# Patient Record
Sex: Male | Born: 1960 | Race: White | Hispanic: No | Marital: Married
Health system: Southern US, Community
[De-identification: ages and names within clinical notes are randomized; demographics above are authoritative.]

## PROBLEM LIST (undated history)

## (undated) DIAGNOSIS — E785 Hyperlipidemia, unspecified: Secondary | ICD-10-CM

## (undated) DIAGNOSIS — E119 Type 2 diabetes mellitus without complications: Secondary | ICD-10-CM

## (undated) DIAGNOSIS — I10 Essential (primary) hypertension: Secondary | ICD-10-CM

## (undated) HISTORY — DX: Essential (primary) hypertension: I10

## (undated) HISTORY — DX: Type 2 diabetes mellitus without complications: E11.9

## (undated) HISTORY — DX: Hyperlipidemia, unspecified: E78.5

---

## 2003-12-14 ENCOUNTER — Encounter: Admission: RE | Admit: 2003-12-14 | Discharge: 2003-12-14 | Payer: Self-pay | Admitting: Family Medicine

## 2005-02-10 IMAGING — US US ABDOMEN COMPLETE
1 series · 14 of 25 positions shown · non-contrast
Comparison: none

CLINICAL DATA: Elevated LFTs.  
 ULTRAOSUND ABDOMEN, COMPLETE ? 12/14/03 
 Comparison ? none.

[Series 1: unknown · 0.30mm/px · 14 of 58 slices shown]
[im 1/58]
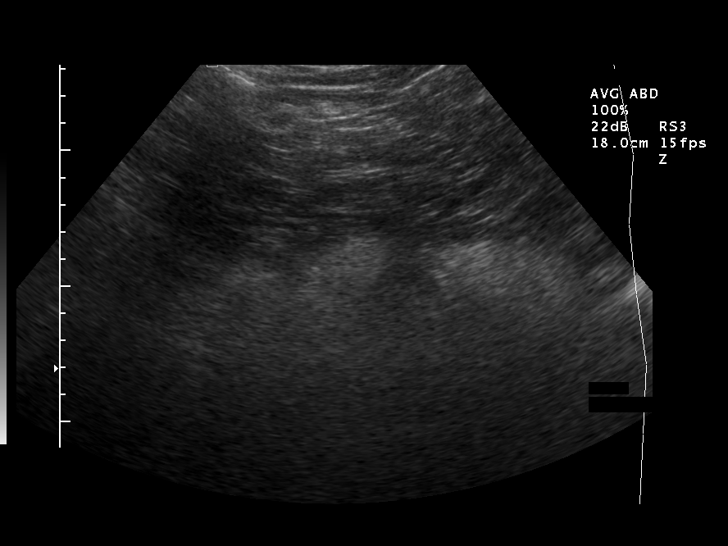
[im 5/58]
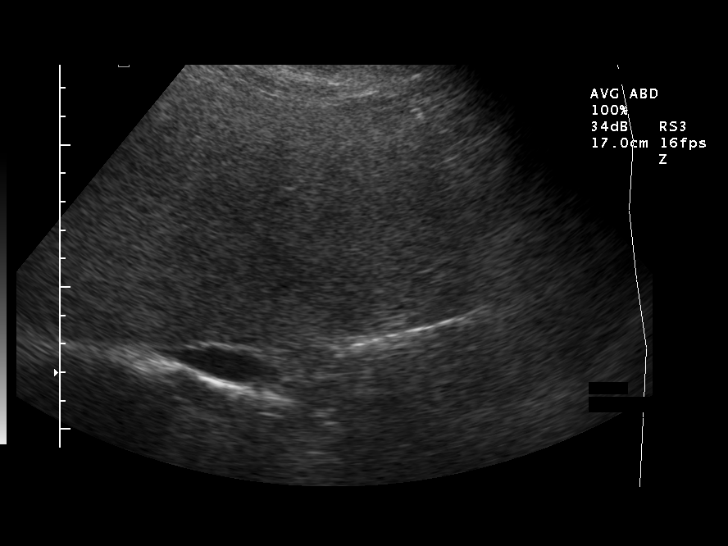
[im 10/58]
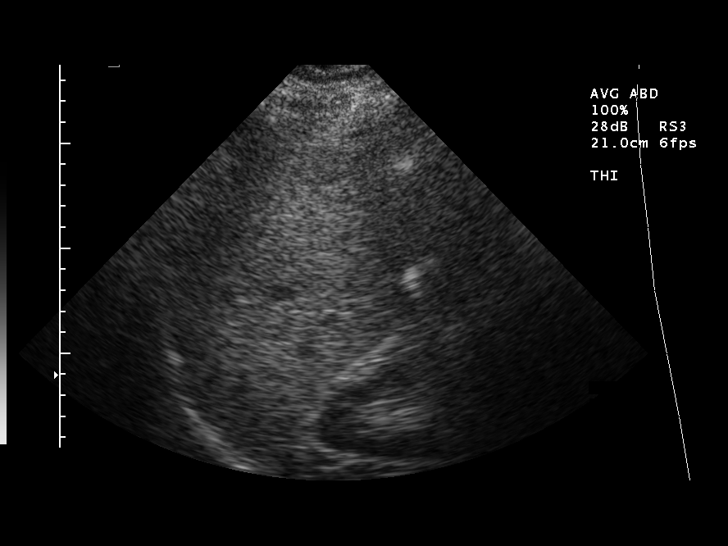
[im 15/58]
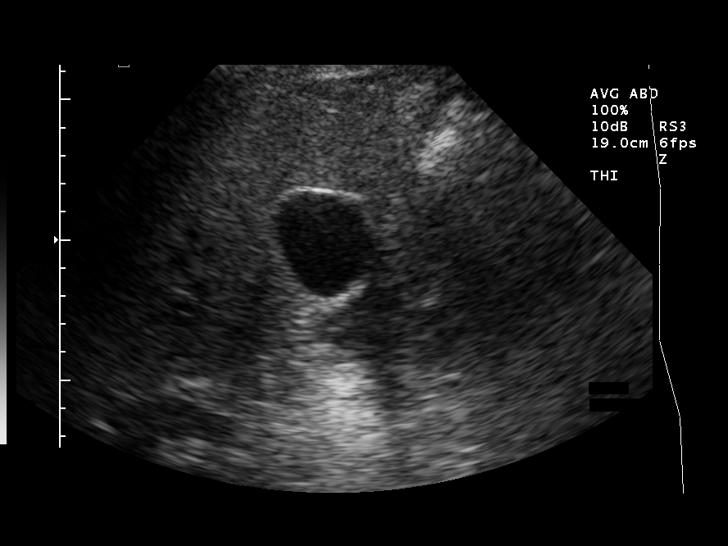
[im 20/58]
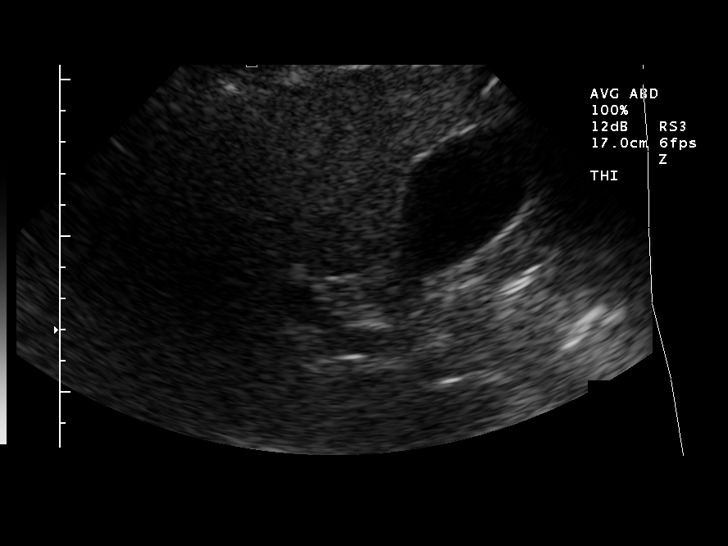
[im 22/58]
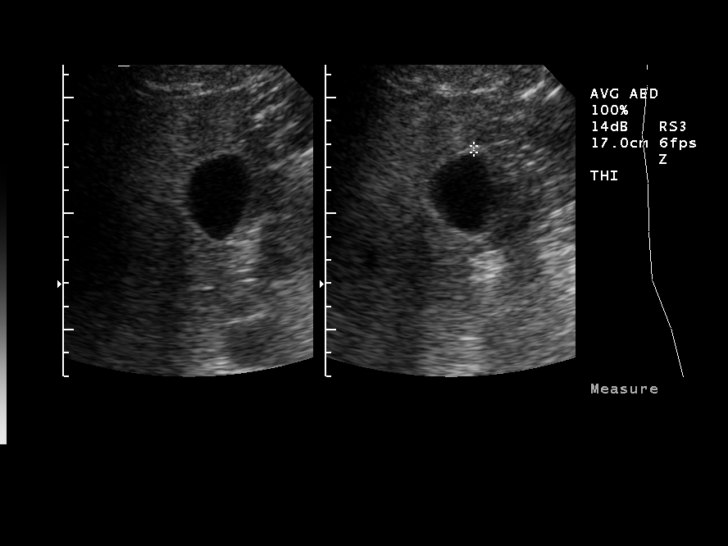
[im 27/58]
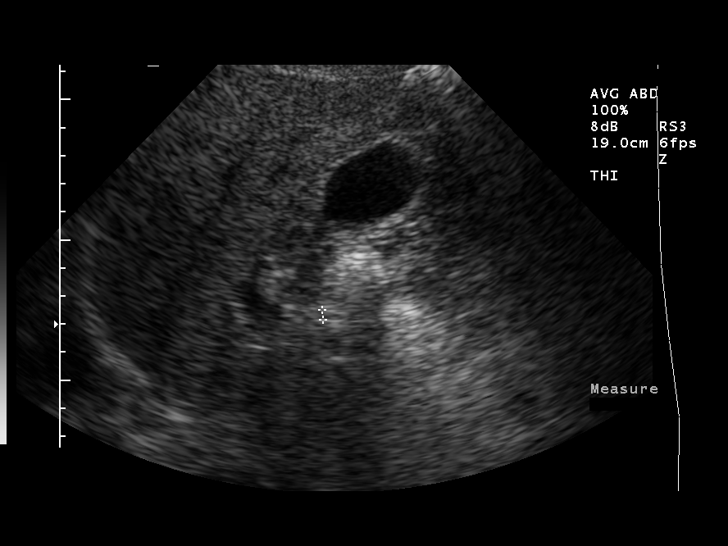
[im 31/58]
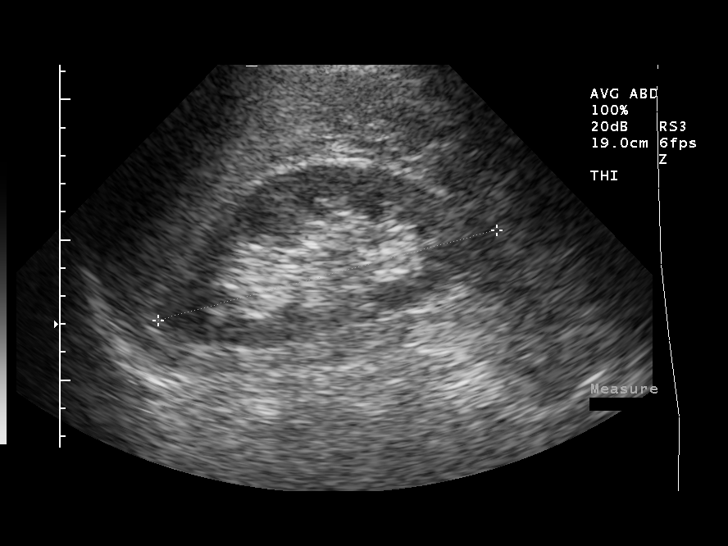
[im 36/58]
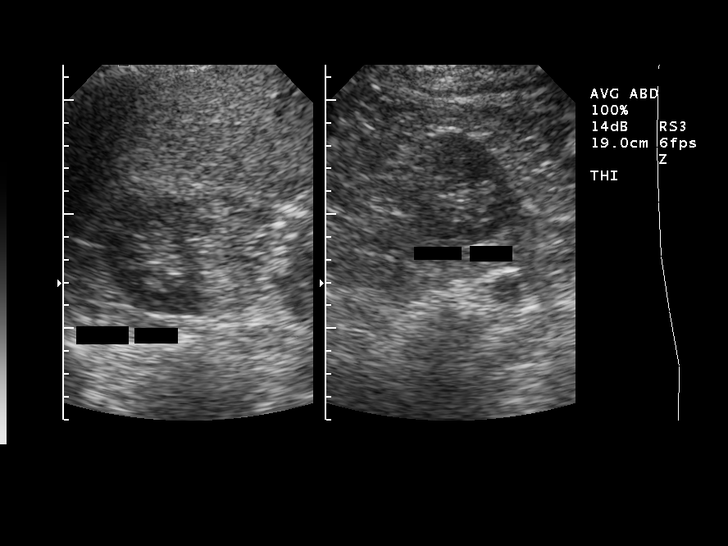
[im 39/58]
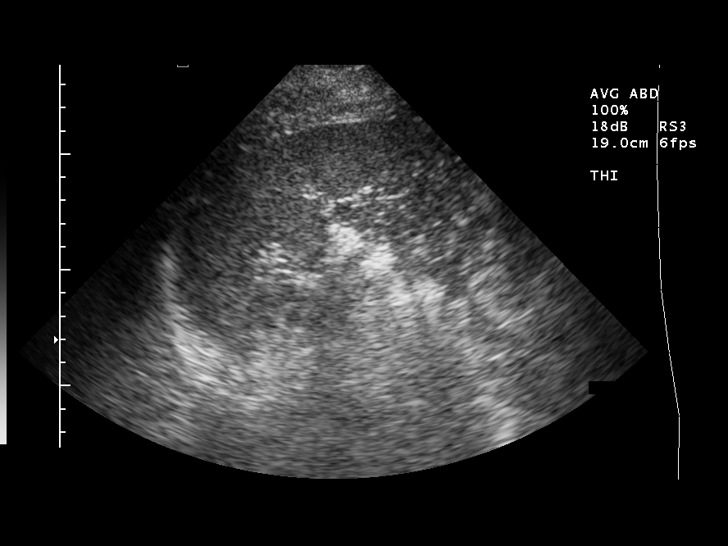
[im 43/58]
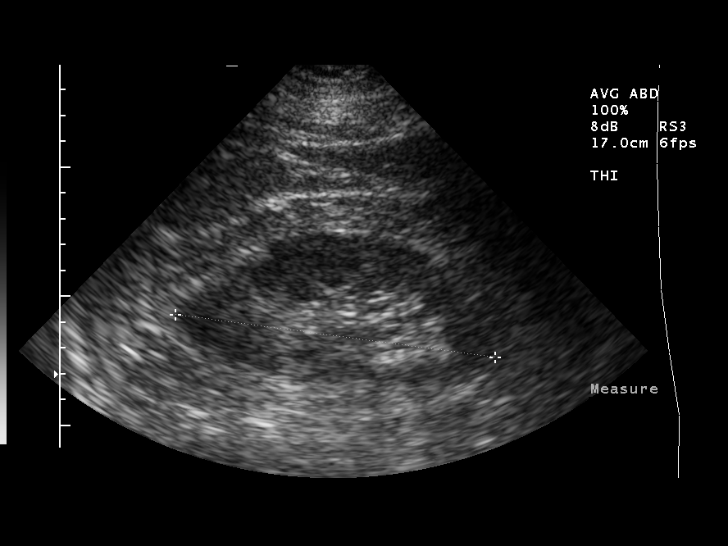
[im 48/58]
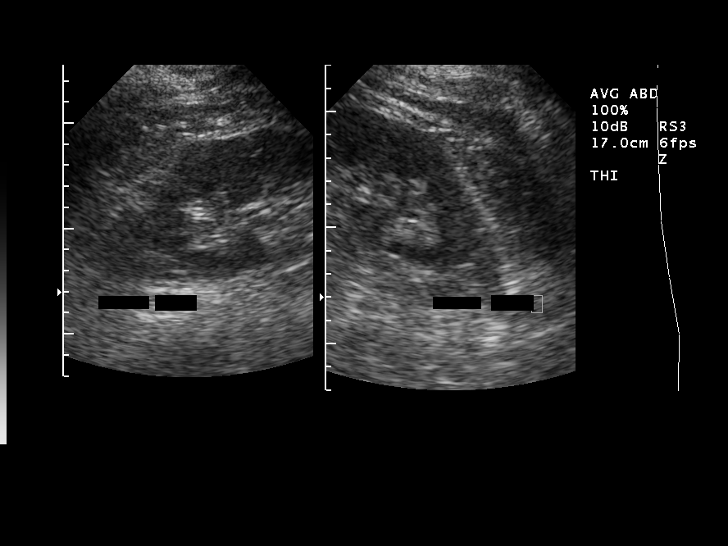
[im 53/58]
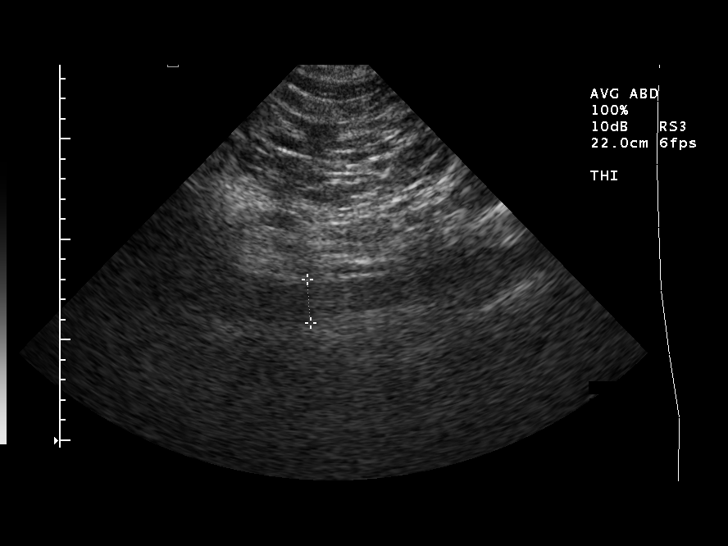
[im 58/58]
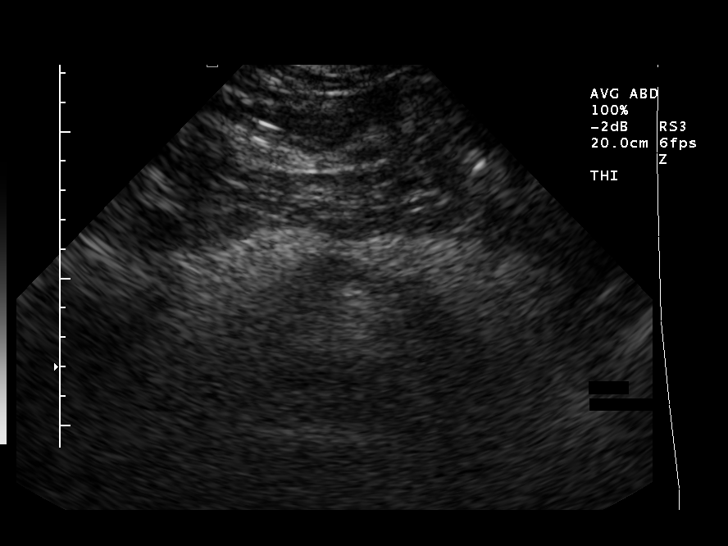

[14 of 25 positions shown; findings below may reference images not displayed]

FINDINGS: Real time multiplanar grayscale ultrasonography of the abdomen was performed.  There is no focal abnormality in the liver or spleen.  No evidence for intra or extrahepatic biliary duct dilatation.  Gallbladder has normal features without evidence for stones, wall thickening, or pericholecystic fluid.  Pancreas is unremarkable.  Right kidney measures 12.5 cm in long axis as does the left kidney.  There is no evidence for renal stones or hydronephrosis.  Abdominal aorta is nondilated and imaged portions of the infrahepatic IVC are unremarkable.  
 IMPRESSION
 Normal abdominal ultrasound.

## 2015-03-19 ENCOUNTER — Ambulatory Visit: Payer: Self-pay

## 2015-03-26 ENCOUNTER — Ambulatory Visit: Payer: Self-pay

## 2015-03-28 ENCOUNTER — Encounter: Payer: BLUE CROSS/BLUE SHIELD | Attending: Family Medicine

## 2015-03-28 VITALS — Ht 74.0 in | Wt 251.7 lb

## 2015-03-28 DIAGNOSIS — Z713 Dietary counseling and surveillance: Secondary | ICD-10-CM | POA: Diagnosis not present

## 2015-03-28 DIAGNOSIS — E119 Type 2 diabetes mellitus without complications: Secondary | ICD-10-CM | POA: Diagnosis present

## 2015-03-28 NOTE — Progress Notes (Signed)
Patient was seen on 03/28/15 for the first of a series of three diabetes self-management courses at the [facility].  Patient Education Plan per assessed needs and concerns is to attend four course education program for Diabetes Self Management Education.  The following learning objectives were met by the patient during this class:  Describe diabetes  State some common risk factors for diabetes  Defines the role of glucose and insulin  Identifies type of diabetes and pathophysiology  Describe the relationship between diabetes and cardiovascular risk  State the members of the Healthcare Team  States the rationale for glucose monitoring  State when to test glucose  State their individual Target Range  State the importance of logging glucose readings  Describe how to interpret glucose readings  Identifies A1C target  Explain the correlation between A1c and eAG values  State symptoms and treatment of high blood glucose  State symptoms and treatment of low blood glucose  Explain proper technique for glucose testing  Identifies proper sharps disposal  Handouts given during class include:  Living Well with Diabetes book  Carb Counting and Meal Planning book  Meal Plan Card  Carbohydrate guide  Meal planning worksheet  Low Sodium Flavoring Tips  The diabetes portion plate  X5F to eAG Conversion Chart  Diabetes Medications  Diabetes Recommended Care Schedule  Support Group  Diabetes Success Plan  Core Class Satisfaction Survey  Follow-Up Plan:  Attend core 2

## 2015-04-02 ENCOUNTER — Ambulatory Visit: Payer: Self-pay

## 2015-04-04 DIAGNOSIS — E119 Type 2 diabetes mellitus without complications: Secondary | ICD-10-CM | POA: Diagnosis not present

## 2015-04-04 NOTE — Progress Notes (Signed)

## 2015-04-11 DIAGNOSIS — E119 Type 2 diabetes mellitus without complications: Secondary | ICD-10-CM

## 2015-04-16 NOTE — Progress Notes (Signed)
  Patient was seen on 04/11/2015 for the third of a series of three diabetes self-management courses at the [facility]. The following learning objectives were met by the patient during this course:  . Describe how diabetes changes over time . Identify diabetes complications and ways to prevent them . Describe strategies that can promote heart health including lowering blood pressure and cholesterol . Describe strategies to lower dietary fat and sodium in the diet . Identify physical activities that benefit cardiovascular health . Evaluate success in meeting personal goal . Describe the belief that they can live successfully with diabetes day to day . Establish 2-3 goals that they will plan to diligently work on until they return for the free 101-month follow-up visit  The following handouts were given in class:  3 Month Follow Up Visit handout  Goal setting handout  Class evaluation form  Your patient has established the following 3 month goals for diabetes self-care:  Count carbohydrates at most meals and snacks  Be active 30 minutes or more 4 x week  Reduce my intake of unhealthy fats  Follow-Up Plan: Patient will attend a 3 month follow-up visit for diabetes self-management education.
